# Patient Record
Sex: Female | Born: 1995 | Race: White | Hispanic: No | Marital: Single | Smoking: Never smoker
Health system: Southern US, Community
[De-identification: ages and names within clinical notes are randomized; demographics above are authoritative.]

---
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
## 2016-10-06 ENCOUNTER — Encounter: Payer: Self-pay | Admitting: Emergency Medicine

## 2016-10-06 DIAGNOSIS — M546 Pain in thoracic spine: Secondary | ICD-10-CM | POA: Insufficient documentation

## 2016-10-06 DIAGNOSIS — M791 Myalgia: Secondary | ICD-10-CM | POA: Diagnosis not present

## 2016-10-06 DIAGNOSIS — S199XXA Unspecified injury of neck, initial encounter: Secondary | ICD-10-CM | POA: Diagnosis present

## 2016-10-06 DIAGNOSIS — S161XXA Strain of muscle, fascia and tendon at neck level, initial encounter: Secondary | ICD-10-CM | POA: Diagnosis not present

## 2016-10-06 DIAGNOSIS — Y999 Unspecified external cause status: Secondary | ICD-10-CM | POA: Diagnosis not present

## 2016-10-06 DIAGNOSIS — Y939 Activity, unspecified: Secondary | ICD-10-CM | POA: Insufficient documentation

## 2016-10-06 DIAGNOSIS — Y92410 Unspecified street and highway as the place of occurrence of the external cause: Secondary | ICD-10-CM | POA: Insufficient documentation
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
## 2016-10-07 ENCOUNTER — Emergency Department: Payer: No Typology Code available for payment source

## 2016-10-07 ENCOUNTER — Emergency Department
Admission: EM | Admit: 2016-10-07 | Discharge: 2016-10-07 | Disposition: A | Discharge status: Home / Self Care | Payer: No Typology Code available for payment source | Attending: Emergency Medicine | Admitting: Emergency Medicine

## 2016-10-07 ENCOUNTER — Encounter: Payer: Self-pay | Admitting: Emergency Medicine

## 2016-10-07 DIAGNOSIS — M7918 Myalgia, other site: Secondary | ICD-10-CM

## 2016-10-07 DIAGNOSIS — M546 Pain in thoracic spine: Secondary | ICD-10-CM

## 2016-10-07 DIAGNOSIS — S161XXA Strain of muscle, fascia and tendon at neck level, initial encounter: Secondary | ICD-10-CM

## 2016-10-07 MED ORDER — LIDOCAINE 5 % EX PTCH
1.0000 | MEDICATED_PATCH | CUTANEOUS | Status: DC
  Administered 2016-10-07: 1 via TRANSDERMAL
  Filled 2016-10-07: qty 1

## 2016-10-07 MED ORDER — LIDOCAINE 5 % EX PTCH
1.0000 | MEDICATED_PATCH | Freq: Two times a day (BID) | CUTANEOUS | 0 refills | Status: AC
Start: 2016-10-07 — End: 2017-10-07

## 2016-10-07 MED ORDER — ETODOLAC 200 MG PO CAPS: 200.0000 mg | ORAL_CAPSULE | Freq: Three times a day (TID) | ORAL | 0 refills | Status: DC

## 2016-10-07 MED ORDER — KETOROLAC TROMETHAMINE 60 MG/2ML IM SOLN
60.0000 mg | Freq: Once | INTRAMUSCULAR | Status: AC
  Administered 2016-10-07: 60 mg via INTRAMUSCULAR
  Filled 2016-10-07: qty 2

## 2016-10-07 MED ORDER — DIAZEPAM 2 MG PO TABS: 2.0000 mg | ORAL_TABLET | Freq: Four times a day (QID) | ORAL | 0 refills | Status: DC | PRN

## 2016-10-07 NOTE — Discharge Instructions (Signed)
PLease follow up with your primary care physician °
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
## 2016-10-07 NOTE — ED Notes (Signed)
Pt gave verbal consent for E-Signature Disposition because pad not working
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
## 2020-04-15 ENCOUNTER — Other Ambulatory Visit: Payer: Self-pay

## 2020-04-15 ENCOUNTER — Ambulatory Visit
Admission: RE | Admit: 2020-04-15 | Discharge: 2020-04-15 | Disposition: A | Discharge status: Home / Self Care | Payer: Self-pay | Source: Ambulatory Visit | Attending: Emergency Medicine | Admitting: Emergency Medicine

## 2020-04-15 VITALS — BP 123/89 | HR 71 | Temp 98.7°F | Resp 16 | Wt 140.0 lb

## 2020-04-15 DIAGNOSIS — S61012A Laceration without foreign body of left thumb without damage to nail, initial encounter: Secondary | ICD-10-CM

## 2020-04-15 DIAGNOSIS — Z23 Encounter for immunization: Secondary | ICD-10-CM

## 2020-04-15 MED ORDER — CEPHALEXIN 500 MG PO CAPS: 500.0000 mg | ORAL_CAPSULE | Freq: Three times a day (TID) | ORAL | 0 refills | Status: AC

## 2020-04-15 MED ORDER — TETANUS-DIPHTH-ACELL PERTUSSIS 5-2.5-18.5 LF-MCG/0.5 IM SUSY
0.5000 mL | PREFILLED_SYRINGE | Freq: Once | INTRAMUSCULAR | Status: AC
  Administered 2020-04-15: 0.5 mL via INTRAMUSCULAR

## 2020-04-15 NOTE — Discharge Instructions (Addendum)
Keep your laceration clean and dry.  Keep a dressing on your thumb until a good scab forms and then you can leave the cut open to the air while you are at home and cover it with a bandage when you are at work.  Take the Keflex 3 times a day for 7 days for prevention of infection.  Wear a finger cot or glove when at work to keep material from contaminating the wound.  Change the dressing if it becomes wet or soiled.  If you develop any drainage from the wound, swelling to the wound, redness, heat, or red streaks going up your hand and arm you need to go to the ER for evaluation.

## 2020-04-15 NOTE — ED Provider Notes (Signed)
[facility]    CSN: [id number] Arrival date & time: 04/15/20  1111      History   Chief Complaint Chief Complaint  Patient presents with  . Extremity Laceration    HPI Donna Ewing is a 25 y.o. female.   HPI   25 year old female here for evaluation of a laceration to her left thumb.  Patient reports that she cut her thumb 3 days ago on a tin can lid.  She states that her fingertip is very painful and it hurts for her to move her thumb.  Patient has no swelling or drainage from the wound.  There is some mild redness around the edges of the wound.  Patient states that she will occasionally get some numbness and tingling at her fingertip but it does not present currently.  Patient has full sensation and movement of her thumb.  History reviewed. No pertinent past medical history.  There are no problems to display for this patient.   History reviewed. No pertinent surgical history.  OB History   No obstetric history on file.      Home Medications    Prior to Admission medications   Medication Sig Start Date End Date Taking? Authorizing Provider  cephALEXin (KEFLEX) 500 MG capsule Take 1 capsule (500 mg total) by mouth 3 (three) times daily for 7 days. 04/15/20 04/22/20 Yes Becky Augusta, NP    Family History Family History  Problem Relation Age of Onset  . Healthy Mother   . Healthy Father     Social History Social History   Tobacco Use  . Smoking status: Never Smoker  . Smokeless tobacco: Never Used  Substance Use Topics  . Alcohol use: No  . Drug use: No     Allergies   Patient has no known allergies.   Review of Systems Review of Systems  Constitutional: Negative for fever.  Musculoskeletal: Negative for arthralgias, joint swelling and myalgias.  Skin: Positive for color change and wound.  Neurological: Positive for numbness.     Physical Exam Triage Vital Signs ED Triage Vitals  Enc Vitals Group     BP 04/15/20 1126 123/89      Pulse Rate 04/15/20 1126 71     Resp 04/15/20 1126 16     Temp 04/15/20 1126 98.7 F (37.1 C)     Temp Source 04/15/20 1126 Oral     SpO2 04/15/20 1126 100 %     Weight 04/15/20 1127 140 lb (63.5 kg)     Height --      Head Circumference --      Peak Flow --      Pain Score 04/15/20 1127 7     Pain Loc --      Pain Edu? --      Excl. in GC? --    No data found.  Updated Vital Signs BP 123/89 (BP Location: Left Arm)   Pulse 71   Temp 98.7 F (37.1 C) (Oral)   Resp 16   Wt 140 lb (63.5 kg)   LMP 04/01/2020   SpO2 100%   BMI 27.34 kg/m   Visual Acuity Right Eye Distance:   Left Eye Distance:   Bilateral Distance:    Right Eye Near:   Left Eye Near:    Bilateral Near:     Physical Exam Vitals and nursing note reviewed.  Constitutional:      General: She is not in acute distress.    Appearance: Normal appearance. She  is normal weight. She is not ill-appearing.  HENT:     Head: Normocephalic and atraumatic.  Cardiovascular:     Rate and Rhythm: Normal rate and regular rhythm.     Pulses: Normal pulses.     Heart sounds: Normal heart sounds. No murmur heard. No gallop.   Pulmonary:     Effort: Pulmonary effort is normal.     Breath sounds: Normal breath sounds. No wheezing, rhonchi or rales.  Musculoskeletal:        General: Signs of injury present. No swelling.  Skin:    Capillary Refill: Capillary refill takes less than 2 seconds.     Findings: Erythema present. No bruising.  Neurological:     General: No focal deficit present.     Mental Status: She is alert and oriented to person, place, and time.  Psychiatric:        Mood and Affect: Mood normal.        Behavior: Behavior normal.        Thought Content: Thought content normal.        Judgment: Judgment normal.      UC Treatments / Results  Labs (all labs ordered are listed, but only abnormal results are displayed) Labs Reviewed - No data to display  EKG   Radiology No results  found.  Procedures Procedures (including critical care time)  Medications Ordered in UC Medications  Tdap (BOOSTRIX) injection 0.5 mL (0.5 mLs Intramuscular Given 04/15/20 1144)    Initial Impression / Assessment and Plan / UC Course  I have reviewed the triage vital signs and the nursing notes.  Pertinent labs & imaging results that were available during my care of the patient were reviewed by me and considered in my medical decision making (see chart for details).   Patient is a very pleasant 25 year old female here for evaluation of a laceration she sustained to her left thumb 3 days ago.  Patient reports that she was opening a can and the lid cut her thumb.  Patient has a horizontal laceration that extends from just below the lateral cuticle of the left thumb to the middle of the pad of her left thumb.  Her IP and MCP joints are freely mobile, nontender, not erythematous, and nonedematous.  Patient has full range of motion of her thumb but it does cause her pain.  There is no drainage from the wound.  There is some slight redness along the lateral edge of the left thumb between the laceration and the IP joint.  The area is not hot, indurated, or fluctuant.  Due to the extended period of time since injury occurred I explained to the patient that we are unable to suture her laceration.  Will provide a dry dressing, wound care instructions, antibiotic prophylaxis, and ER precautions.  Will also update tetanus shot.   Final Clinical Impressions(s) / UC Diagnoses   Final diagnoses:  Laceration of left thumb without foreign body without damage to nail, initial encounter     Discharge Instructions     Keep your laceration clean and dry.  Keep a dressing on your thumb until a good scab forms and then you can leave the cut open to the air while you are at home and cover it with a bandage when you are at work.  Take the Keflex 3 times a day for 7 days for prevention of infection.  Wear a  finger cot or glove when at work to keep material from contaminating the wound.  Change the dressing if it becomes wet or soiled.  If you develop any drainage from the wound, swelling to the wound, redness, heat, or red streaks going up your hand and arm you need to go to the ER for evaluation.    ED Prescriptions    Medication Sig Dispense Auth. Provider   cephALEXin (KEFLEX) 500 MG capsule Take 1 capsule (500 mg total) by mouth 3 (three) times daily for 7 days. 21 capsule Becky Augusta, NP     PDMP not reviewed this encounter.   Becky Augusta, NP 04/15/20 1148

## 2020-04-15 NOTE — ED Triage Notes (Signed)
Patient c/o left thumb laceration x 3 days. Per patient painful at left thumb .
